# Patient Record
Sex: Male | Born: 1995 | Race: Black or African American | Hispanic: No | Marital: Single | Smoking: Never smoker
Health system: Southern US, Community
[De-identification: ages and names within clinical notes are randomized; demographics above are authoritative.]

---
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
## 2017-05-10 ENCOUNTER — Encounter: Payer: Self-pay | Admitting: Emergency Medicine

## 2017-05-10 ENCOUNTER — Emergency Department
Admission: EM | Admit: 2017-05-10 | Discharge: 2017-05-10 | Disposition: A | Discharge status: Home / Self Care | Payer: Medicaid Other | Attending: Student in an Organized Health Care Education/Training Program | Admitting: Student in an Organized Health Care Education/Training Program

## 2017-05-10 DIAGNOSIS — R197 Diarrhea, unspecified: Secondary | ICD-10-CM | POA: Diagnosis not present

## 2017-05-10 DIAGNOSIS — R1013 Epigastric pain: Secondary | ICD-10-CM | POA: Insufficient documentation

## 2017-05-10 DIAGNOSIS — R112 Nausea with vomiting, unspecified: Secondary | ICD-10-CM

## 2017-05-10 MED ORDER — ONDANSETRON 4 MG PO TBDP
4.0000 mg | ORAL_TABLET | Freq: Once | ORAL | Status: AC
  Administered 2017-05-10: 4 mg via ORAL
  Filled 2017-05-10: qty 1

## 2017-05-10 MED ORDER — GI COCKTAIL ~~LOC~~
30.0000 mL | Freq: Once | ORAL | Status: AC
  Administered 2017-05-10: 30 mL via ORAL
  Filled 2017-05-10: qty 30

## 2017-05-10 MED ORDER — PROMETHAZINE HCL 12.5 MG PO TABS: 12.5000 mg | ORAL_TABLET | Freq: Four times a day (QID) | ORAL | 0 refills | Status: DC | PRN

## 2017-05-10 NOTE — ED Notes (Addendum)
Patient feeling better with decreased abdominal pain after medication.  Patient tolerated fluids well with no N/V/D. MD notified.

## 2017-05-10 NOTE — ED Provider Notes (Signed)
[facility] Emergency Department Provider Note    First MD Initiated Contact with Patient 05/10/17 1758     (approximate)  I have reviewed the triage vital signs and the nursing notes.   HISTORY  Chief Complaint Abdominal Pain    HPI Tim Willis is a 21 y.o. male presents with generalized epigastric pain that started yesterday associated with nausea vomiting and diarrhea. He is having nonbloody nonbilious vomiting and loose stools without any blood in it. He's not been having any fevers. He has not tried to eat anything due to upset stomach. His mother is sick with similar symptoms that started 2 days ago and he caught similar illness. Denies any chronic medical issues. No family history of inflammatory bowel disease. Patient does not want any blood work done and just wants something to help settle his stomach.   History reviewed. No pertinent past medical history. No family history on file. History reviewed. No pertinent surgical history. There are no active problems to display for this patient.     Prior to Admission medications   Medication Sig Start Date End Date Taking? Authorizing Provider  amoxicillin (AMOXIL) 500 MG tablet Take 1 tablet (500 mg total) by mouth 3 (three) times daily. 05/20/15   Triplett, Cari B, FNP  lidocaine (XYLOCAINE) 2 % solution Use as directed 20 mLs in the mouth or throat as needed for mouth pain. 08/18/15   Triplett, Cari B, FNP  triamcinolone (KENALOG) 0.1 % paste Use as directed 1 application in the mouth or throat 2 (two) times daily. 07/14/15   Joni ReiningSmith, Ronald K, PA-C    Allergies Patient has no known allergies.    Social History Social History  Substance Use Topics  . Smoking status: Never Smoker  . Smokeless tobacco: Never Used  . Alcohol use No    Review of Systems Patient denies headaches, rhinorrhea, blurry vision, numbness, shortness of breath, chest pain, edema, cough, abdominal pain, nausea, vomiting,  diarrhea, dysuria, fevers, rashes or hallucinations unless otherwise stated above in HPI. ____________________________________________   PHYSICAL EXAM:  VITAL SIGNS: Vitals:   05/10/17 1749  BP: 136/87  Pulse: 93  Resp: 16  Temp: 98.5 F (36.9 C)    Constitutional: Alert and oriented. Well appearing and in no acute distress. Eyes: Conjunctivae are normal. PERRL. EOMI. Head: Atraumatic. Nose: No congestion/rhinnorhea. Mouth/Throat: Mucous membranes are moist.  Oropharynx non-erythematous. Neck: No stridor. Painless ROM. No cervical spine tenderness to palpation Hematological/Lymphatic/Immunilogical: No cervical lymphadenopathy. Cardiovascular: Normal rate, regular rhythm. Grossly normal heart sounds.  Good peripheral circulation. Respiratory: Normal respiratory effort.  No retractions. Lungs CTAB. Gastrointestinal: Soft and nontender. No distention. No abdominal bruits. No CVA tenderness. Genitourinary:  Musculoskeletal: No lower extremity tenderness nor edema.  No joint effusions. Neurologic:  Normal speech and language. No gross focal neurologic deficits are appreciated. No gait instability. Skin:  Skin is warm, dry and intact. No rash noted. Psychiatric: Mood and affect are normal. Speech and behavior are normal.  ____________________________________________   LABS (all labs ordered are listed, but only abnormal results are displayed)  No results found for this or any previous visit (from the past 24 hour(s)). ____________________________________________ ____________________________________________   PROCEDURES  Procedure(s) performed:  Procedures    Critical Care performed: no ____________________________________________   INITIAL IMPRESSION / ASSESSMENT AND PLAN / ED COURSE  Pertinent labs & imaging results that were available during my care of the patient were reviewed by me and considered in my medical decision making (see chart for  details).  DDX:  enteritis, gastritis, viral illness  Tim Willis is a 21 y.o. who presents to the ED with epigastric pain and nausea vomiting and diarrhea as described above.  Patient is AFVSS in ED. Exam as above. Given current presentation have considered the above differential.  Patient's abdominal exam is diffusely nontender and benign. I recommended IV fluids and evaluation laboratory testing but the patient has declined this. As he is otherwise healthy with stable vital signs do think it's reasonable to trial of Zofran ODT. Patient was given this with the GI cocktail was able to tolerate oral hydration and felt much better. Repeat abdominal exam was soft and benign. Discussed signs and symptoms for which the patient should immediately return.  Patient was able to tolerate PO and was able to ambulate with a steady gait.  Have discussed with the patient and available family all diagnostics and treatments performed thus far and all questions were answered to the best of my ability. The patient demonstrates understanding and agreement with plan.       ____________________________________________   FINAL CLINICAL IMPRESSION(S) / ED DIAGNOSES  Final diagnoses:  Nausea vomiting and diarrhea      NEW MEDICATIONS STARTED DURING THIS VISIT:  New Prescriptions   No medications on file     Note:  This document was prepared using Dragon voice recognition software and may include unintentional dictation errors.    Willy Eddy, MD 05/10/17 2220

## 2017-05-10 NOTE — ED Triage Notes (Signed)
Pt comes into the ED via POv c/o generalize epigastric pain that started yesterday.  Patient has N/V/D and has been exposed to a stomach virus from his mother.  Patient in NAD at this time with even and unlabored respirations.  Patient denies vomiting any today, but states he is still having multiple episodes of diarrhea today.

## 2017-05-10 NOTE — ED Notes (Signed)
NAD noted at time of D/C. Pt denies questions or concerns. Pt ambulatory to the lobby at this time.  

## 2017-05-23 ENCOUNTER — Emergency Department
Admission: EM | Admit: 2017-05-23 | Discharge: 2017-05-24 | Disposition: A | Discharge status: Home / Self Care | Payer: Medicaid Other | Attending: Emergency Medicine | Admitting: Emergency Medicine

## 2017-05-23 ENCOUNTER — Emergency Department: Payer: Medicaid Other

## 2017-05-23 ENCOUNTER — Encounter: Payer: Self-pay | Admitting: Emergency Medicine

## 2017-05-23 DIAGNOSIS — N50812 Left testicular pain: Secondary | ICD-10-CM

## 2017-05-23 DIAGNOSIS — I861 Scrotal varices: Secondary | ICD-10-CM | POA: Insufficient documentation

## 2017-05-23 LAB — URINALYSIS, COMPLETE (UACMP) WITH MICROSCOPIC
BILIRUBIN URINE: NEGATIVE
Glucose, UA: NEGATIVE mg/dL
HGB URINE DIPSTICK: NEGATIVE
Ketones, ur: NEGATIVE mg/dL
LEUKOCYTES UA: NEGATIVE
Nitrite: NEGATIVE
PH: 7 (ref 5.0–8.0)
Protein, ur: NEGATIVE mg/dL
SPECIFIC GRAVITY, URINE: 1.024 (ref 1.005–1.030)

## 2017-05-23 NOTE — ED Notes (Signed)
Pt is unable to void for UA at this time; understands specimen needed when he is able to void; pt will notify staff when he is able to provide specimen

## 2017-05-23 NOTE — ED Notes (Signed)
Pt ambulatory with steady gait to treatment room; verbal report given to Danelle EarthlyNoel, RN and Dr Dolores FrameSung

## 2017-05-23 NOTE — ED Triage Notes (Signed)
Pt reports left testicular pain with swelling x 1 week; here tonight because the pain has increased, but no increase in swelling; denies penile discharge; denies urinary s/s; pt says he has had intercourse this week and that did not worsen his symptoms

## 2017-05-24 MED ORDER — IBUPROFEN 800 MG PO TABS: 800.0000 mg | ORAL_TABLET | Freq: Three times a day (TID) | ORAL | 0 refills | Status: DC | PRN

## 2017-05-24 MED ORDER — KETOROLAC TROMETHAMINE 30 MG/ML IJ SOLN
60.0000 mg | Freq: Once | INTRAMUSCULAR | Status: AC
  Administered 2017-05-24: 60 mg via INTRAMUSCULAR
  Filled 2017-05-24: qty 2

## 2017-05-24 NOTE — ED Provider Notes (Signed)
[facility] Emergency Department Provider Note   ____________________________________________   First MD Initiated Contact with Patient 05/24/17 0016     (approximate)  I have reviewed the triage vital signs and the nursing notes.   HISTORY  Chief Complaint Testicle Pain    HPI Tim Willis is a 21 y.o. male who presents to the ED from home with a chief complaint of left testicle pain. Patient reports left testicular pain with swelling 1 week. Presents tonight secondary to increase in pain. Denies penile discharge, dysuria or hematuria. Had sexual intercourse this week which did not worsen his symptoms. Denies associated fever, chills, chest pain, shortness of breath, abdominal pain, nausea, vomiting, diarrhea. Denies recent travel or trauma. Nothing makes his symptoms better or worse.   Past medical history None  There are no active problems to display for this patient.   History reviewed. No pertinent surgical history.  Prior to Admission medications   Medication Sig Start Date End Date Taking? Authorizing Provider  ibuprofen (ADVIL,MOTRIN) 800 MG tablet Take 1 tablet (800 mg total) by mouth every 8 (eight) hours as needed for moderate pain. 05/24/17   Irean Hong, MD    Allergies Patient has no known allergies.  History reviewed. No pertinent family history.  Social History Social History  Substance Use Topics  . Smoking status: Never Smoker  . Smokeless tobacco: Never Used  . Alcohol use No    Review of Systems  Constitutional: No fever/chills. Eyes: No visual changes. ENT: No sore throat. Cardiovascular: Denies chest pain. Respiratory: Denies shortness of breath. Gastrointestinal: No abdominal pain.  No nausea, no vomiting.  No diarrhea.  No constipation. Genitourinary: Positive for left testicle pain and swelling. Negative for dysuria. Musculoskeletal: Negative for back pain. Skin: Negative for rash. Neurological: Negative  for headaches, focal weakness or numbness.   ____________________________________________   PHYSICAL EXAM:  VITAL SIGNS: ED Triage Vitals  Enc Vitals Group     BP 05/23/17 2150 130/77     Pulse Rate 05/23/17 2150 84     Resp 05/23/17 2150 18     Temp 05/23/17 2150 98.5 F (36.9 C)     Temp Source 05/23/17 2150 Oral     SpO2 05/23/17 2150 99 %     Weight 05/23/17 2150 150 lb (68 kg)     Height 05/23/17 2150 5\' 7"  (1.702 m)     Head Circumference --      Peak Flow --      Pain Score 05/23/17 2149 8     Pain Loc --      Pain Edu? --      Excl. in GC? --     Constitutional: Alert and oriented. Well appearing and in no acute distress. Eyes: Conjunctivae are normal. PERRL. EOMI. Head: Atraumatic. Nose: No congestion/rhinnorhea. Mouth/Throat: Mucous membranes are moist.  Oropharynx non-erythematous. Neck: No stridor.   Cardiovascular: Normal rate, regular rhythm. Grossly normal heart sounds.  Good peripheral circulation. Respiratory: Normal respiratory effort.  No retractions. Lungs CTAB. Gastrointestinal: Soft and nontender to light or deep palpation. No distention. No abdominal bruits. No CVA tenderness. Genitourinary: Circumcised male. No urethral discharge. Right testicle nonswollen and nontender. Left testicle nonswollen and tender to palpation. No horizontal lie. Strong bilateral cremasteric reflexes. Musculoskeletal: No lower extremity tenderness nor edema.  No joint effusions. Neurologic:  Normal speech and language. No gross focal neurologic deficits are appreciated. No gait instability. Skin:  Skin is warm, dry and intact. No rash noted. Psychiatric:  Mood and affect are normal. Speech and behavior are normal.  ____________________________________________   LABS (all labs ordered are listed, but only abnormal results are displayed)  Labs Reviewed  URINALYSIS, COMPLETE (UACMP) WITH MICROSCOPIC - Abnormal; Notable for the following:       Result Value   Color, Urine  YELLOW (*)    APPearance HAZY (*)    Bacteria, UA RARE (*)    Squamous Epithelial / LPF 0-5 (*)    All other components within normal limits   ____________________________________________  EKG  None ____________________________________________  RADIOLOGY  [place] Scrotum  Result Date: 05/23/2017 CLINICAL DATA:  Left testicular swelling and pain EXAM: SCROTAL ULTRASOUND DOPPLER ULTRASOUND OF THE TESTICLES TECHNIQUE: Complete ultrasound examination of the testicles, epididymis, and other scrotal structures was performed. Color and spectral Doppler ultrasound were also utilized to evaluate blood flow to the testicles. COMPARISON:  None. FINDINGS: Right testicle Measurements: 5.0 x 2.7 x 3.1 cm. No mass or microlithiasis visualized. Left testicle Measurements: 4.7 x 2.6 x 2.5 cm. No mass or microlithiasis visualized. Right epididymis:  Normal in size and appearance. Left epididymis:  Normal in size and appearance. Hydrocele:  Small bilateral Varicocele:  Left varicocele measures 3.6 mm. Pulsed Doppler interrogation of both testes demonstrates normal low resistance arterial and venous waveforms bilaterally. IMPRESSION: No acute abnormality of the scrotum.  Small left varicocele. Electronically Signed   By: Deatra RobinsonKevin  Herman M.D.   On: 05/23/2017 23:10   [place] Art/ven Flow Abd Pelv Doppler  Result Date: 05/23/2017 CLINICAL DATA:  Left testicular swelling and pain EXAM: SCROTAL ULTRASOUND DOPPLER ULTRASOUND OF THE TESTICLES TECHNIQUE: Complete ultrasound examination of the testicles, epididymis, and other scrotal structures was performed. Color and spectral Doppler ultrasound were also utilized to evaluate blood flow to the testicles. COMPARISON:  None. FINDINGS: Right testicle Measurements: 5.0 x 2.7 x 3.1 cm. No mass or microlithiasis visualized. Left testicle Measurements: 4.7 x 2.6 x 2.5 cm. No mass or microlithiasis visualized. Right epididymis:  Normal in size and appearance. Left epididymis:  Normal in size  and appearance. Hydrocele:  Small bilateral Varicocele:  Left varicocele measures 3.6 mm. Pulsed Doppler interrogation of both testes demonstrates normal low resistance arterial and venous waveforms bilaterally. IMPRESSION: No acute abnormality of the scrotum.  Small left varicocele. Electronically Signed   By: Deatra RobinsonKevin  Herman M.D.   On: 05/23/2017 23:10    ____________________________________________   PROCEDURES  Procedure(s) performed: None  Procedures  Critical Care performed: No  ____________________________________________   INITIAL IMPRESSION / ASSESSMENT AND PLAN / ED COURSE  Pertinent labs & imaging results that were available during my care of the patient were reviewed by me and considered in my medical decision making (see chart for details).  21 year old male who presents with a one-week history of left testicular pain and swelling. Urinalysis unremarkable. Ultrasound reveals varicocele. Will treat with NSAID, scrotal supporter, and urology follow-up as needed. Strict return precautions given. Patient verbalizes understanding and agrees with plan of care.      ____________________________________________   FINAL CLINICAL IMPRESSION(S) / ED DIAGNOSES  Final diagnoses:  Left varicocele      NEW MEDICATIONS STARTED DURING THIS VISIT:  New Prescriptions   IBUPROFEN (ADVIL,MOTRIN) 800 MG TABLET    Take 1 tablet (800 mg total) by mouth every 8 (eight) hours as needed for moderate pain.     Note:  This document was prepared using Dragon voice recognition software and may include unintentional dictation errors.    Irean HongSung, Jade J, MD 05/24/17  0613  

## 2017-05-24 NOTE — Discharge Instructions (Signed)
1. You may take pain medicine as needed. 2. Elevate scrotum as often as possible. 3. Return to the ER for worsening symptoms, persistent vomiting, difficulty breathing or other concerns.

## 2017-07-06 ENCOUNTER — Emergency Department: Payer: Medicaid Other

## 2017-07-06 ENCOUNTER — Emergency Department
Admission: EM | Admit: 2017-07-06 | Discharge: 2017-07-06 | Disposition: A | Discharge status: Home / Self Care | Payer: Medicaid Other | Attending: Emergency Medicine | Admitting: Emergency Medicine

## 2017-07-06 DIAGNOSIS — S90111A Contusion of right great toe without damage to nail, initial encounter: Secondary | ICD-10-CM | POA: Diagnosis not present

## 2017-07-06 DIAGNOSIS — Y9289 Other specified places as the place of occurrence of the external cause: Secondary | ICD-10-CM | POA: Diagnosis not present

## 2017-07-06 DIAGNOSIS — Y939 Activity, unspecified: Secondary | ICD-10-CM | POA: Insufficient documentation

## 2017-07-06 DIAGNOSIS — Z79899 Other long term (current) drug therapy: Secondary | ICD-10-CM | POA: Diagnosis not present

## 2017-07-06 DIAGNOSIS — Y990 Civilian activity done for income or pay: Secondary | ICD-10-CM | POA: Diagnosis not present

## 2017-07-06 DIAGNOSIS — S99921A Unspecified injury of right foot, initial encounter: Secondary | ICD-10-CM | POA: Diagnosis present

## 2017-07-06 DIAGNOSIS — W208XXA Other cause of strike by thrown, projected or falling object, initial encounter: Secondary | ICD-10-CM | POA: Insufficient documentation

## 2017-07-06 MED ORDER — MELOXICAM 7.5 MG PO TABS: 7.5000 mg | ORAL_TABLET | Freq: Every day | ORAL | 1 refills | Status: AC

## 2017-07-06 NOTE — ED Provider Notes (Signed)
[facility] Emergency Department Provider Note  ____________________________________________  Time seen: Approximately 8:54 PM  I have reviewed the triage vital signs and the nursing notes.   HISTORY  Chief Complaint Toe Injury (great toe, right foot)    HPI Tim Willis is a 21 y.o. male presenting to the emergency department with right great toe pain after a grill at work fell on his right foot. Patient denies radiculopathy, weakness or changes in sensation of the right lower extremity. No prior traumas or surgeries to the right foot. No alleviating measures have been attempted.   History reviewed. No pertinent past medical history.  There are no active problems to display for this patient.   History reviewed. No pertinent surgical history.  Prior to Admission medications   Medication Sig Start Date End Date Taking? Authorizing Provider  ibuprofen (ADVIL,MOTRIN) 800 MG tablet Take 1 tablet (800 mg total) by mouth every 8 (eight) hours as needed for moderate pain. 05/24/17   Irean Hong, MD  meloxicam (MOBIC) 7.5 MG tablet Take 1 tablet (7.5 mg total) by mouth daily. 07/06/17 07/13/17  Orvil Feil, PA-C    Allergies Patient has no known allergies.  History reviewed. No pertinent family history.  Social History Social History  Substance Use Topics  . Smoking status: Never Smoker  . Smokeless tobacco: Never Used  . Alcohol use No     Review of Systems  Constitutional: No fever/chills Eyes: No visual changes. No discharge ENT: No upper respiratory complaints. Cardiovascular: no chest pain. Respiratory: no cough. No SOB. Gastrointestinal: No abdominal pain.  No nausea, no vomiting.  No diarrhea.  No constipation. Musculoskeletal: Patient has right great toe pain. Skin: Negative for rash, abrasions, lacerations, ecchymosis. Neurological: Negative for headaches, focal weakness or  numbness.   ____________________________________________   PHYSICAL EXAM:  VITAL SIGNS: ED Triage Vitals  Enc Vitals Group     BP 07/06/17 2011 128/69     Pulse Rate 07/06/17 2011 64     Resp 07/06/17 2011 15     Temp 07/06/17 2011 98.1 F (36.7 C)     Temp Source 07/06/17 2011 Oral     SpO2 07/06/17 2011 100 %     Weight 07/06/17 2013 145 lb (65.8 kg)     Height 07/06/17 2013 5\' 7"  (1.702 m)     Head Circumference --      Peak Flow --      Pain Score 07/06/17 2011 8     Pain Loc --      Pain Edu? --      Excl. in GC? --      Constitutional: Alert and oriented. Well appearing and in no acute distress. Eyes: Conjunctivae are normal. PERRL. EOMI. Head: Atraumatic. Cardiovascular: Normal rate, regular rhythm. Normal S1 and S2.  Good peripheral circulation. Respiratory: Normal respiratory effort without tachypnea or retractions. Lungs CTAB. Good air entry to the bases with no decreased or absent breath sounds. Musculoskeletal: Patient has 5 out of 5 strength in the lower extremities bilaterally. Patient is able to move all 5 right toes. Tenderness is elicited with palpation along the distal phalanx of the right great toe. Palpable dorsalis pedis pulse bilaterally and symmetrically. Neurologic:  Normal speech and language. No gross focal neurologic deficits are appreciated.  Skin:  Skin is warm, dry and intact. No rash noted. Psychiatric: Mood and affect are normal. Speech and behavior are normal. Patient exhibits appropriate insight and judgement.   ____________________________________________   LABS (all  labs ordered are listed, but only abnormal results are displayed)  Labs Reviewed - No data to display ____________________________________________  EKG   ____________________________________________  RADIOLOGY Geraldo PitterI, Jaclyn M Woods, personally viewed and evaluated these images (plain radiographs) as part of my medical decision making, as well as reviewing the written  report by the radiologist.  Dg Foot Complete Right  Result Date: 07/06/2017 CLINICAL DATA:  Pain in the right great toe after crush injury. EXAM: RIGHT FOOT COMPLETE - 3+ VIEW COMPARISON:  None. FINDINGS: There is no evidence of fracture or dislocation. There is no evidence of arthropathy or other focal bone abnormality. Soft tissues are unremarkable. IMPRESSION: Negative. Electronically Signed   By: Kennith CenterEric  Mansell M.D.   On: 07/06/2017 20:37    ____________________________________________    PROCEDURES  Procedure(s) performed:    Procedures    Medications - No data to display   ____________________________________________   INITIAL IMPRESSION / ASSESSMENT AND PLAN / ED COURSE  Pertinent labs & imaging results that were available during my care of the patient were reviewed by me and considered in my medical decision making (see chart for details).  Review of the [place] CSRS was performed in accordance of the [facility] prior to dispensing any controlled drugs.    Assessment and Plan: Right Great Toe Contusion:  Patient's diagnosis is consistent with right great toe contusion. Patient will be discharged home with prescriptions for Mobic. Patient is to follow up with Dr. Orland Jarredroxler as needed or otherwise directed. Patient is given ED precautions to return to the ED for any worsening or new symptoms.     ____________________________________________  FINAL CLINICAL IMPRESSION(S) / ED DIAGNOSES  Final diagnoses:  Contusion of right great toe without damage to nail, initial encounter      NEW MEDICATIONS STARTED DURING THIS VISIT:  New Prescriptions   MELOXICAM (MOBIC) 7.5 MG TABLET    Take 1 tablet (7.5 mg total) by mouth daily.        This chart was dictated using voice recognition software/Dragon. Despite best efforts to proofread, errors can occur which can change the meaning. Any change was purely unintentional.    Gasper LloydWoods, Jaclyn M, PA-C 07/06/17 2059    Sharman CheekStafford,  Phillip, MD 07/07/17 2325

## 2017-07-06 NOTE — ED Triage Notes (Signed)
Patient reports a girl at work fell onto his great to right foot. Pt c/o pain at the base of the toenail. Pt has no visible swelling/bruising to the area. Pt is able to flex/extend to with no issue.

## 2018-08-24 IMAGING — US US ART/VEN ABD/PELV/SCROTUM DOPPLER LTD
1 series · 14 of 25 positions shown · non-contrast
Comparison: None.

CLINICAL DATA: Left testicular swelling and pain

EXAM:
SCROTAL ULTRASOUND
DOPPLER ULTRASOUND OF THE TESTICLES
TECHNIQUE: Complete ultrasound examination of the testicles, epididymis, and
other scrotal structures was performed. Color and spectral Doppler
ultrasound were also utilized to evaluate blood flow to the
testicles.

[Series 1: us art/ven abd/pelv/scrotum doppler ltd · 0.08mm/px · 14 of 59 slices shown]
[im 1/59]
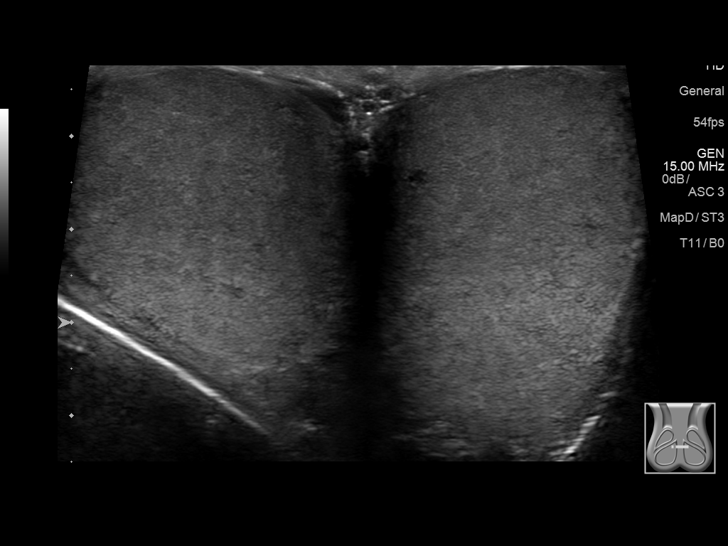
[im 5/59]
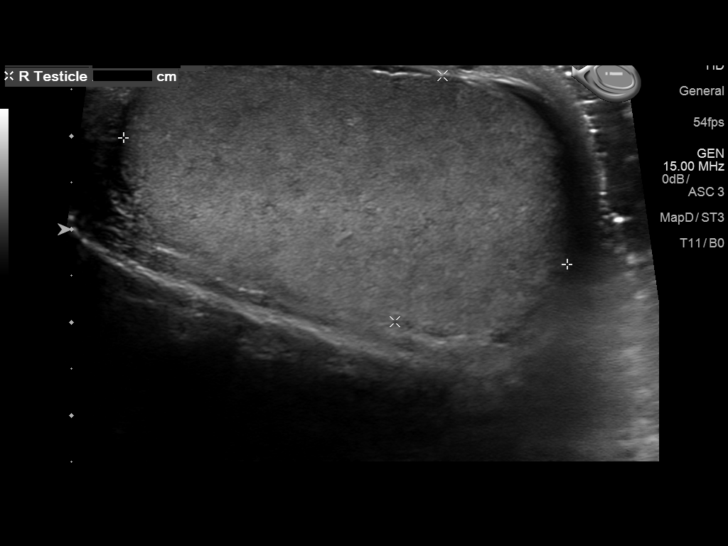
[im 10/59]
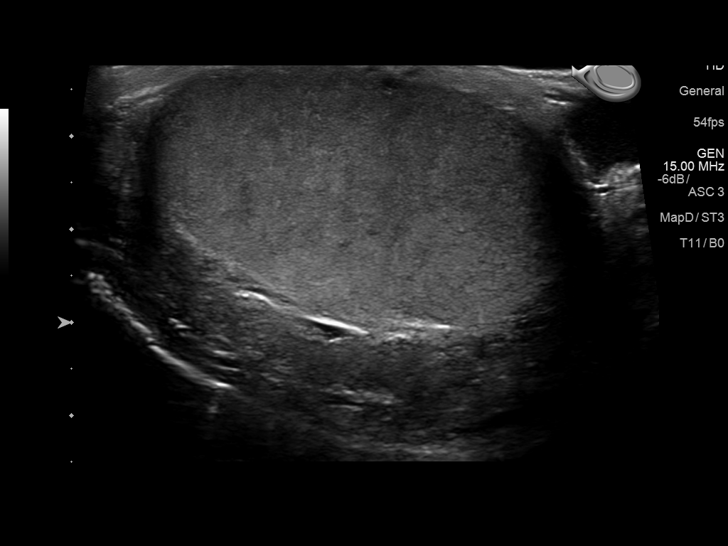
[im 15/59]
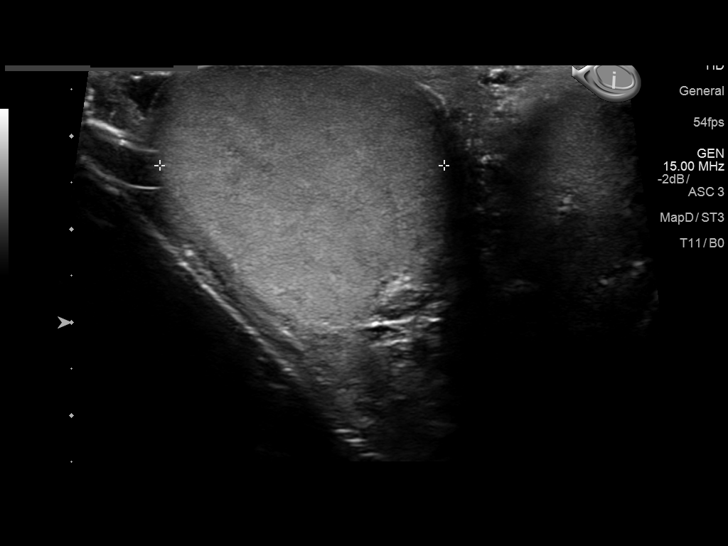
[im 20/59]
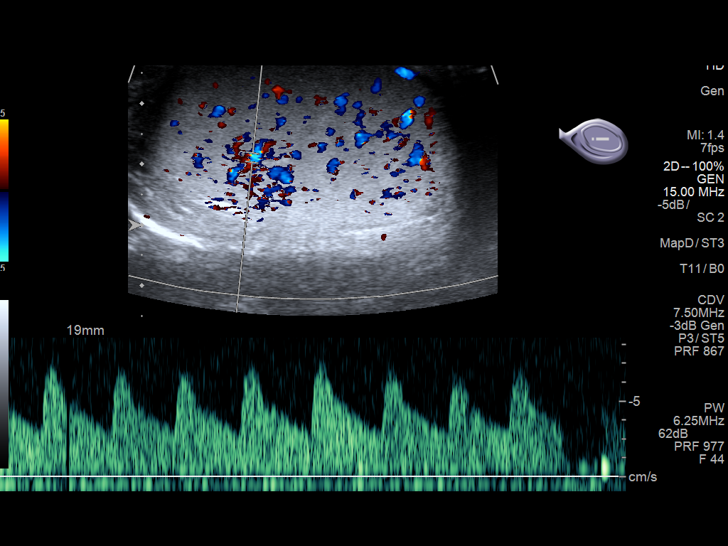
[im 22/59]
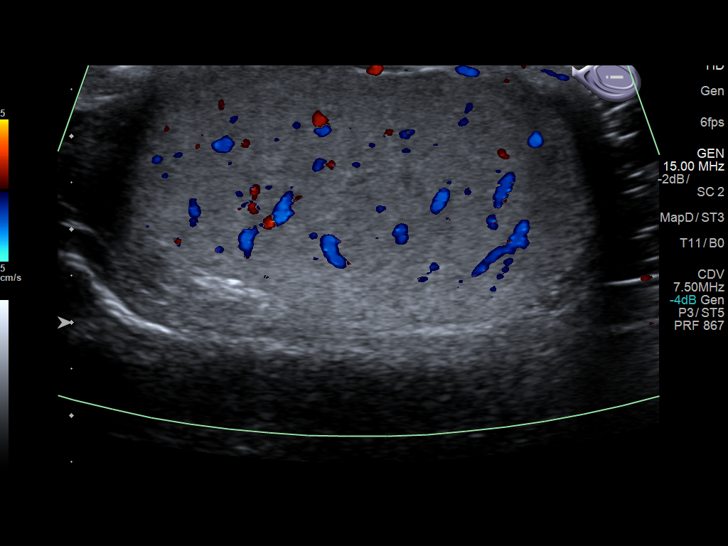
[im 27/59]
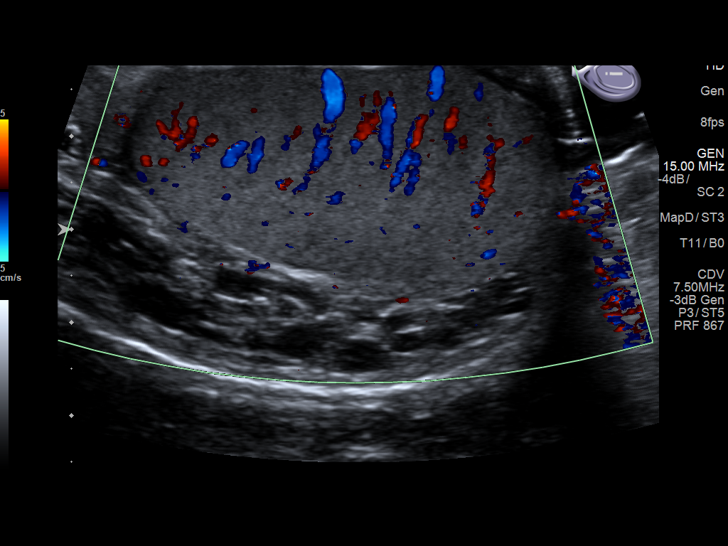
[im 32/59]
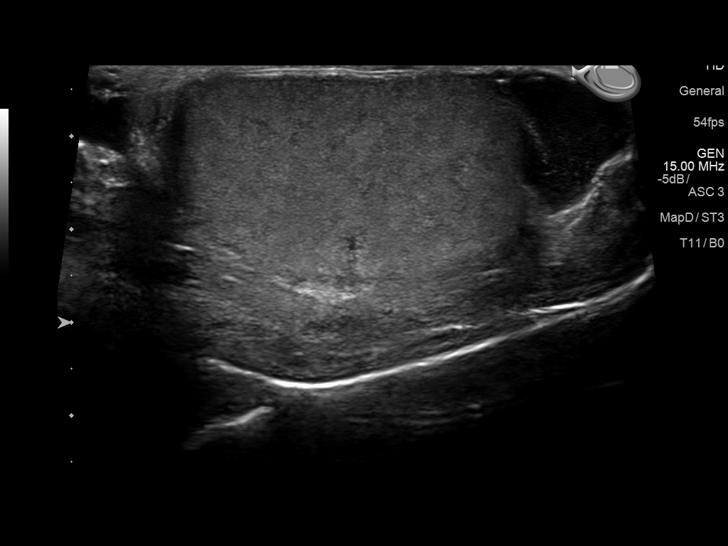
[im 37/59]
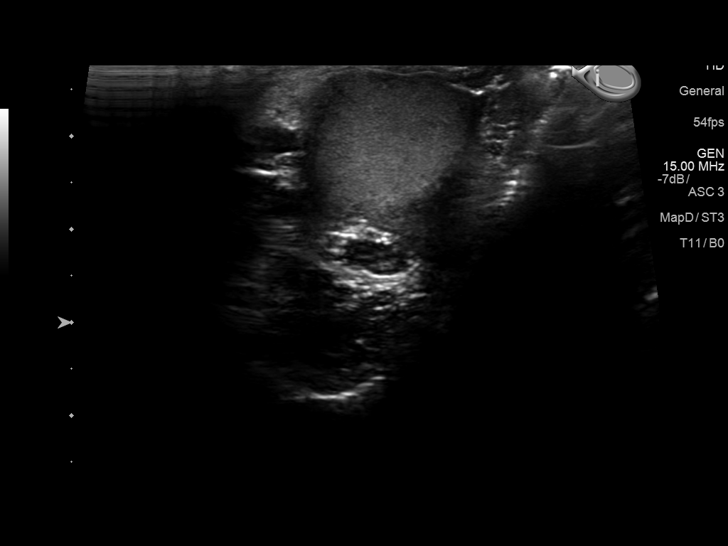
[im 39/59]
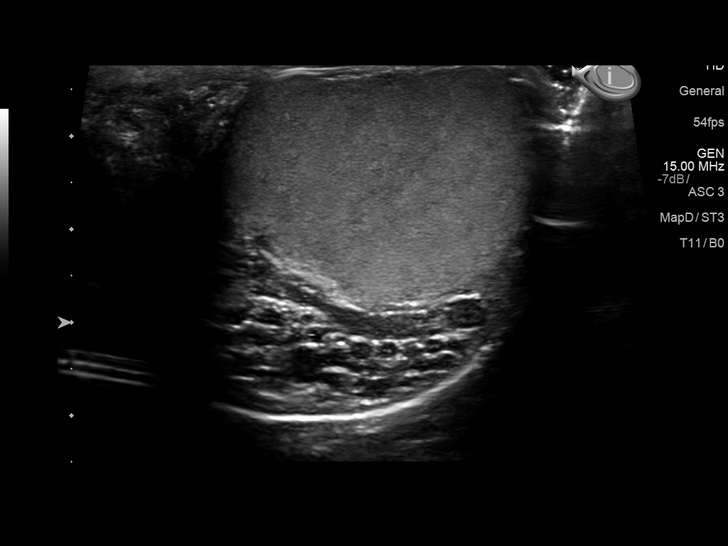
[im 44/59]
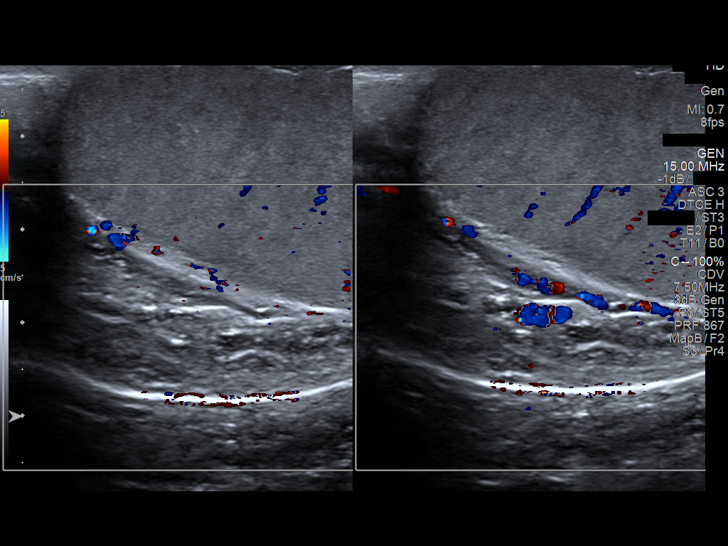
[im 49/59]
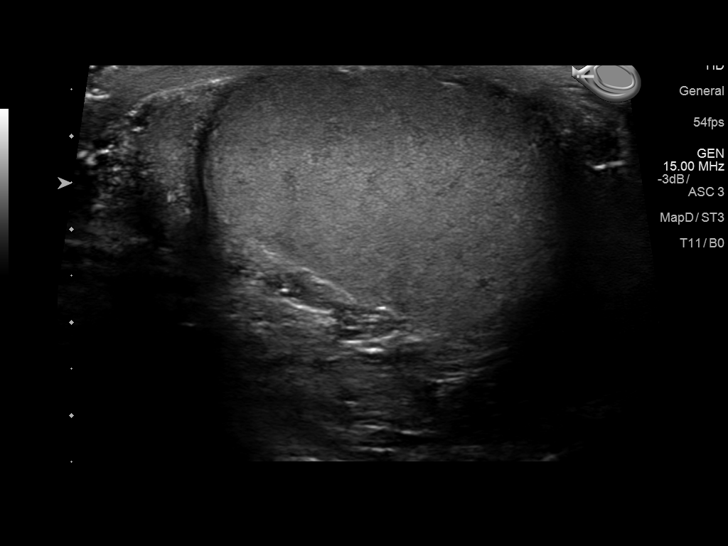
[im 54/59]
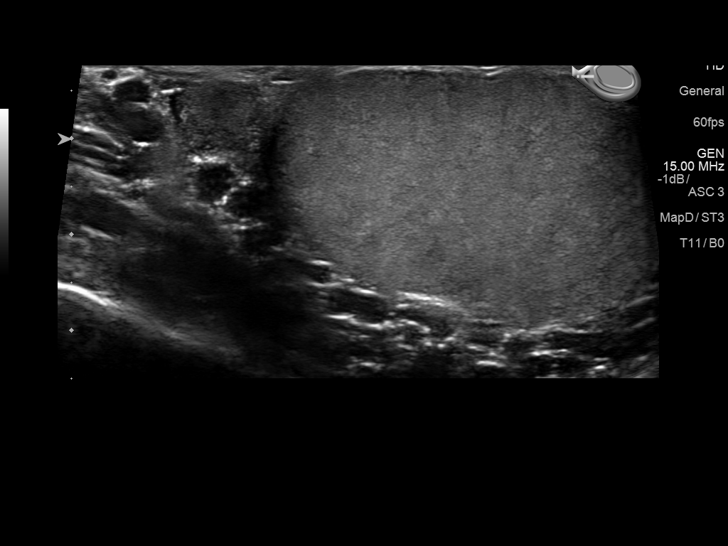
[im 59/59]
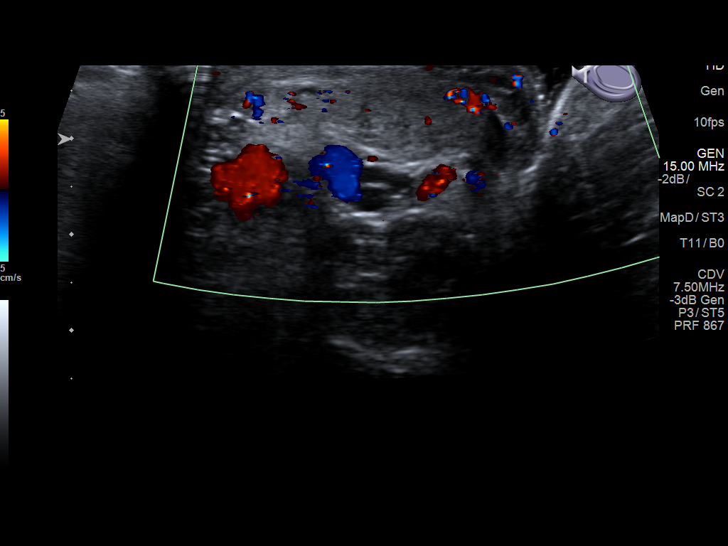

[14 of 25 positions shown; findings below may reference images not displayed]

FINDINGS: Right testicle

Measurements: 5.0 x 2.7 x 3.1 cm. No mass or microlithiasis
visualized.

Left testicle

Measurements: 4.7 x 2.6 x 2.5 cm. No mass or microlithiasis
visualized.

Right epididymis:  Normal in size and appearance.

Left epididymis:  Normal in size and appearance.

Hydrocele:  Small bilateral

Varicocele:  Left varicocele measures 3.6 mm.

Pulsed Doppler interrogation of both testes demonstrates normal low
resistance arterial and venous waveforms bilaterally.
IMPRESSION: No acute abnormality of the scrotum.  Small left varicocele.

## 2018-12-29 ENCOUNTER — Other Ambulatory Visit: Payer: Self-pay

## 2018-12-29 ENCOUNTER — Encounter: Payer: Self-pay | Admitting: Emergency Medicine

## 2018-12-29 ENCOUNTER — Emergency Department
Admission: EM | Admit: 2018-12-29 | Discharge: 2018-12-29 | Disposition: A | Discharge status: Home / Self Care | Payer: Self-pay | Attending: Emergency Medicine | Admitting: Emergency Medicine

## 2018-12-29 ENCOUNTER — Emergency Department: Payer: Self-pay

## 2018-12-29 DIAGNOSIS — I861 Scrotal varices: Secondary | ICD-10-CM | POA: Insufficient documentation

## 2018-12-29 DIAGNOSIS — N5089 Other specified disorders of the male genital organs: Secondary | ICD-10-CM | POA: Insufficient documentation

## 2018-12-29 LAB — CHLAMYDIA/NGC RT PCR (ARMC ONLY)
Chlamydia Tr: DETECTED — AB
N gonorrhoeae: NOT DETECTED

## 2018-12-29 LAB — URINALYSIS, COMPLETE (UACMP) WITH MICROSCOPIC
Bacteria, UA: NONE SEEN
Bilirubin Urine: NEGATIVE
Glucose, UA: NEGATIVE mg/dL
Hgb urine dipstick: NEGATIVE
Ketones, ur: NEGATIVE mg/dL
Leukocytes, UA: NEGATIVE
Nitrite: NEGATIVE
Protein, ur: NEGATIVE mg/dL
Specific Gravity, Urine: 1.019 (ref 1.005–1.030)
pH: 6 (ref 5.0–8.0)

## 2018-12-29 MED ORDER — IBUPROFEN 800 MG PO TABS: 800.0000 mg | ORAL_TABLET | Freq: Three times a day (TID) | ORAL | 0 refills | Status: DC | PRN

## 2018-12-29 NOTE — ED Provider Notes (Signed)
[facility] Emergency Department Provider Note  ____________________________________________  Time seen: Approximately 7:16 PM  I have reviewed the triage vital signs and the nursing notes.   HISTORY  Chief Complaint Testicle Pain    HPI Tim Willis is a 23 y.o. male presents to the emergency department with left-sided testicle pain, swelling and erythema.  Patient reports that he has had left-sided low back pain and increased urinary frequency.  No dysuria or hematuria.  No nausea or vomiting.  Patient denies penile discharge or pain with sexual intercourse.  Patient reports that he was diagnosed with a varicocele several months ago but reports that his pain feels somewhat different.  Patient has been taking ibuprofen for pain which has not relieved his symptoms.  He currently rates his pain at 8 out of 10 out of intensity.  No alleviating measures have been attempted.   History reviewed. No pertinent past medical history.  There are no active problems to display for this patient.   History reviewed. No pertinent surgical history.  Prior to Admission medications   Medication Sig Start Date End Date Taking? Authorizing Provider  ibuprofen (ADVIL,MOTRIN) 800 MG tablet Take 1 tablet (800 mg total) by mouth every 8 (eight) hours as needed for moderate pain. 12/29/18   Orvil FeilWoods, Laiyla Slagel M, PA-C    Allergies Patient has no known allergies.  No family history on file.  Social History Social History   Tobacco Use  . Smoking status: Never Smoker  . Smokeless tobacco: Never Used  Substance Use Topics  . Alcohol use: No  . Drug use: No     Review of Systems  Constitutional: No fever/chills Eyes: No visual changes. No discharge ENT: No upper respiratory complaints. Cardiovascular: no chest pain. Respiratory: no cough. No SOB. Gastrointestinal: No abdominal pain.  No nausea, no vomiting.  No diarrhea.  No constipation. Genitourinary: Negative for dysuria.  No hematuria Musculoskeletal: Negative for musculoskeletal pain. Skin: Negative for rash, abrasions, lacerations, ecchymosis. Neurological: Negative for headaches, focal weakness or numbness.   ____________________________________________   PHYSICAL EXAM:  VITAL SIGNS: ED Triage Vitals  Enc Vitals Group     BP 12/29/18 1801 (!) 143/85     Pulse Rate 12/29/18 1801 62     Resp 12/29/18 1801 16     Temp 12/29/18 1801 98.3 F (36.8 C)     Temp Source 12/29/18 1801 Oral     SpO2 12/29/18 1801 99 %     Weight --      Height --      Head Circumference --      Peak Flow --      Pain Score 12/29/18 1802 0     Pain Loc --      Pain Edu? --      Excl. in GC? --      Constitutional: Alert and oriented. Well appearing and in no acute distress. Eyes: Conjunctivae are normal. PERRL. EOMI. Head: Atraumatic. Cardiovascular: Normal rate, regular rhythm. Normal S1 and S2.  Good peripheral circulation. Respiratory: Normal respiratory effort without tachypnea or retractions. Lungs CTAB. Good air entry to the bases with no decreased or absent breath sounds. Gastrointestinal: Bowel sounds 4 quadrants. Soft and nontender to palpation. No guarding or rigidity. No palpable masses. No distention. No CVA tenderness. Genitourinary: Patient has mild left-sided scrotal swelling.  Musculoskeletal: Full range of motion to all extremities. No gross deformities appreciated. Neurologic:  Normal speech and language. No gross focal neurologic deficits are appreciated.  Skin:  Skin is warm, dry and intact. No rash noted. Psychiatric: Mood and affect are normal. Speech and behavior are normal. Patient exhibits appropriate insight and judgement.   ____________________________________________   LABS (all labs ordered are listed, but only abnormal results are displayed)  Labs Reviewed  URINALYSIS, COMPLETE (UACMP) WITH MICROSCOPIC - Abnormal; Notable for the following components:      Result Value    Color, Urine YELLOW (*)    APPearance CLEAR (*)    All other components within normal limits  CHLAMYDIA/NGC RT PCR [facility] ONLY)   ____________________________________________  EKG   ____________________________________________  RADIOLOGY   US Scrotum W/doppler  Result Date: 12/29/2018 CLINICAL DATA:  23 y/o M; testicular pain, left testicular swelling for a few months. EXAM: SCROTAL ULTRASOUND DOPPLER ULTRASOUND OF THE TESTICLES TECHNIQUE: Complete ultrasound examination of the testicles, epididymis, and other scrotal structures was performed. Color and spectral Doppler ultrasound were also utilized to evaluate blood flow to the testicles. COMPARISON:  05/23/2017 scrotal ultrasound and Doppler. FINDINGS: Right testicle Measurements: 5.0 x 2.2 x 3.5 cm. No mass or microlithiasis visualized. Left testicle Measurements: 4.8 x 2.3 x 3.3 cm. No mass or microlithiasis visualized. Right epididymis:  Normal in size and appearance. Left epididymis:  Normal in size and appearance. Hydrocele:  None visualized. Varicocele:  Stable left varicocele measuring 3.7 mm. Pulsed Doppler interrogation of both testes demonstrates normal low resistance arterial and venous waveforms bilaterally. IMPRESSION: No acute process identified.  Stable small left varicocele. Electronically Signed   By: Mitzi Hansen M.D.   On: 12/29/2018 20:26    ____________________________________________    PROCEDURES  Procedure(s) performed:    Procedures    Medications - No data to display   ____________________________________________   INITIAL IMPRESSION / ASSESSMENT AND PLAN / ED COURSE  Pertinent labs & imaging results that were available during my care of the patient were reviewed by me and considered in my medical decision making (see chart for details).  Review of the [place] CSRS was performed in accordance of the [facility] prior to dispensing any controlled drugs.    Assessment and  Plan: Testicular swelling Varicocele Patient presents to the emergency department with intermittent testicular pain and swelling.  Patient reports that he has had overlying erythema of the testicle.  Patient underwent a scrotal ultrasound in the emergency department which was noncontributory for testicular torsion.  Scrotal ultrasound did reveal a small varicocele consistent with prior ultrasound conducted in 2018.  Patient was discharged with ibuprofen and referred to urology.  Patient education regarding varicocele was given and all patient questions were answered.    ____________________________________________  FINAL CLINICAL IMPRESSION(S) / ED DIAGNOSES  Final diagnoses:  Varicocele      NEW MEDICATIONS STARTED DURING THIS VISIT:  ED Discharge Orders         Ordered    ibuprofen (ADVIL,MOTRIN) 800 MG tablet  Every 8 hours PRN     12/29/18 2036              This chart was dictated using voice recognition software/Dragon. Despite best efforts to proofread, errors can occur which can change the meaning. Any change was purely unintentional.    Gasper Lloyd 12/29/18 2109    Dionne Bucy, MD 12/29/18 2351

## 2018-12-29 NOTE — ED Triage Notes (Addendum)
PT c/o LFT testicle swelling xfew months. PT states he was seen for same symptoms here before and given antibiotics and anti inflammatories . No discoloration noted or GU symptoms noted. VSS

## 2018-12-29 NOTE — ED Notes (Signed)
Discharge and prescription reviewed with patient. Patient stated understanding and had no questions. Patient signed discharge.

## 2018-12-30 ENCOUNTER — Telehealth: Payer: Self-pay | Admitting: Emergency Medicine

## 2018-12-30 NOTE — Telephone Encounter (Signed)
Called patient due to positive chlamydia test and need for treatment.  Per dr Mayford Knifewilliams can call in azithromycin 1 gram po for patient.  There was no answer and no voicemail.  Will send letter if he does not call me back.

## 2021-10-12 ENCOUNTER — Other Ambulatory Visit: Payer: Self-pay

## 2021-10-12 ENCOUNTER — Emergency Department
Admission: EM | Admit: 2021-10-12 | Discharge: 2021-10-12 | Disposition: A | Discharge status: Home / Self Care | Payer: Self-pay | Attending: Emergency Medicine | Admitting: Emergency Medicine

## 2021-10-12 ENCOUNTER — Emergency Department: Payer: Self-pay

## 2021-10-12 ENCOUNTER — Encounter: Payer: Self-pay | Admitting: Emergency Medicine

## 2021-10-12 DIAGNOSIS — G44209 Tension-type headache, unspecified, not intractable: Secondary | ICD-10-CM | POA: Insufficient documentation

## 2021-10-12 MED ORDER — IBUPROFEN 800 MG PO TABS: 800.0000 mg | ORAL_TABLET | Freq: Three times a day (TID) | ORAL | 0 refills | Status: DC | PRN

## 2021-10-12 MED ORDER — METHOCARBAMOL 500 MG PO TABS: 500.0000 mg | ORAL_TABLET | Freq: Four times a day (QID) | ORAL | 0 refills | Status: DC | PRN

## 2021-10-12 NOTE — ED Triage Notes (Signed)
Pt reports HA intermittently for the last several months. Pt reports hurts across the top of his head and forehead. Pt reports last took medication 2 days ago with no relief. Pt denies all other sx's.

## 2021-10-12 NOTE — Discharge Instructions (Signed)
Drink plenty of water.  Dehydration causes bad headaches Have your vision checked as squinting and straining your eyes can give you a headache If not improving with these measures please follow-up with neurology.  Dr. Daisy Blossom phone number is attached but you can see anyone in the clinic. Return emergency department if worse

## 2021-10-12 NOTE — ED Provider Notes (Signed)
[facility] Emergency Department Provider Note  ____________________________________________   Event Date/Time   First MD Initiated Contact with Patient 10/12/21 1057     (approximate)  I have reviewed the triage vital signs and the nursing notes.   HISTORY  Chief Complaint Headache    HPI Tim Willis is a 25 y.o. male presents emergency department complaint of headache for 2 to 3 months.  States it does not always completely go away.  States he is taking over-the-counter medications without any relief.  States his mother had some prescription medicine he tried which also did not help.  States it starts as a pressure at his temples and will radiate back to the neck.  Also has some neck discomfort.  Denies fever or chills.  No chest pain or shortness of breath  History reviewed. No pertinent past medical history.  There are no problems to display for this patient.   History reviewed. No pertinent surgical history.  Prior to Admission medications   Medication Sig Start Date End Date Taking? Authorizing Provider  ibuprofen (ADVIL) 800 MG tablet Take 1 tablet (800 mg total) by mouth every 8 (eight) hours as needed. 10/12/21  Yes Rosemae Mcquown, Roselyn Bering, PA-C  methocarbamol (ROBAXIN) 500 MG tablet Take 1 tablet (500 mg total) by mouth every 6 (six) hours as needed for muscle spasms (tension headache). 10/12/21  Yes Faythe Ghee, PA-C    Allergies Patient has no known allergies.  No family history on file.  Social History Social History   Tobacco Use   Smoking status: Never   Smokeless tobacco: Never  Substance Use Topics   Alcohol use: No   Drug use: No    Review of Systems  Constitutional: No fever/chills Eyes: No visual changes. ENT: No sore throat. Respiratory: Denies cough Cardiovascular: Denies chest pain Gastrointestinal: Denies abdominal pain Genitourinary: Negative for dysuria. Musculoskeletal: Negative for back pain. Skin: Negative  for rash. Psychiatric: no mood changes,     ____________________________________________   PHYSICAL EXAM:  VITAL SIGNS: ED Triage Vitals  Enc Vitals Group     BP 10/12/21 1020 123/89     Pulse Rate 10/12/21 1020 69     Resp 10/12/21 1020 18     Temp 10/12/21 1020 98.9 F (37.2 C)     Temp Source 10/12/21 1020 Oral     SpO2 10/12/21 1020 100 %     Weight 10/12/21 1001 150 lb (68 kg)     Height 10/12/21 1001 5\' 7"  (1.702 m)     Head Circumference --      Peak Flow --      Pain Score 10/12/21 1001 10     Pain Loc --      Pain Edu? --      Excl. in GC? --     Constitutional: Alert and oriented. Well appearing and in no acute distress. Eyes: Conjunctivae are normal.  PERRL, EOMI Head: Atraumatic.  Skull is nontender, no sores, lumps noted Nose: No congestion/rhinnorhea. Mouth/Throat: Mucous membranes are moist.   Neck:  supple no lymphadenopathy noted Cardiovascular: Normal rate, regular rhythm. Heart sounds are normal Respiratory: Normal respiratory effort.  No retractions, lungs c t a  GU: deferred Musculoskeletal: FROM all extremities, warm and well perfused Neurologic:  Normal speech and language.  Cranial nerves II through XII grossly intact, grips equal bilaterally Skin:  Skin is warm, dry and intact. No rash noted. Psychiatric: Mood and affect are normal. Speech and behavior are normal.  ____________________________________________   LABS (all labs ordered are listed, but only abnormal results are displayed)  Labs Reviewed - No data to display ____________________________________________   ____________________________________________  RADIOLOGY  CT of the head  ____________________________________________   PROCEDURES  Procedure(s) performed: No  Procedures    ____________________________________________   INITIAL IMPRESSION / ASSESSMENT AND PLAN / ED COURSE  Pertinent labs & imaging results that were available during my care of the patient  were reviewed by me and considered in my medical decision making (see chart for details).   The patient is a 25 year old male with new onset of headaches.  Patient states headaches have been ongoing for the past 2 to 3 months.  See HPI.  Physical exam shows patient appears stable.  Due to new onset of headache not being relieved by medications, CT of the head ordered  CT of the head is negative for any acute abnormality  I did discuss findings with patient.  Advised him to have a vision check to make sure he is not straining his eyes.  If he is not improving by drinking water and taking these medications he should follow-up with neurology.  States he understands.  He is discharged stable condition.  Tim Willis was evaluated in Emergency Department on 10/12/2021 for the symptoms described in the history of present illness. He was evaluated in the context of the global COVID-19 pandemic, which necessitated consideration that the patient might be at risk for infection with the SARS-CoV-2 virus that causes COVID-19. Institutional protocols and algorithms that pertain to the evaluation of patients at risk for COVID-19 are in a state of rapid change based on information released by regulatory bodies including the CDC and federal and state organizations. These policies and algorithms were followed during the patient's care in the ED.    As part of my medical decision making, I reviewed the following data within the electronic MEDICAL RECORD NUMBER Nursing notes reviewed and incorporated, Old chart reviewed, Radiograph reviewed , Notes from prior ED visits, and [place] Controlled Substance Database  ____________________________________________   FINAL CLINICAL IMPRESSION(S) / ED DIAGNOSES  Final diagnoses:  Tension headache      NEW MEDICATIONS STARTED DURING THIS VISIT:  New Prescriptions   IBUPROFEN (ADVIL) 800 MG TABLET    Take 1 tablet (800 mg total) by mouth every 8 (eight) hours as needed.    METHOCARBAMOL (ROBAXIN) 500 MG TABLET    Take 1 tablet (500 mg total) by mouth every 6 (six) hours as needed for muscle spasms (tension headache).     Note:  This document was prepared using Dragon voice recognition software and may include unintentional dictation errors.    Faythe Ghee, PA-C 10/12/21 1222    Arnaldo Natal, MD 10/12/21 [phone]

## 2022-06-06 ENCOUNTER — Other Ambulatory Visit: Payer: Self-pay

## 2022-06-06 ENCOUNTER — Encounter: Payer: Self-pay | Admitting: Intensive Care

## 2022-06-06 ENCOUNTER — Emergency Department
Admission: EM | Admit: 2022-06-06 | Discharge: 2022-06-06 | Disposition: A | Discharge status: Home / Self Care | Payer: Self-pay | Attending: Emergency Medicine | Admitting: Emergency Medicine

## 2022-06-06 DIAGNOSIS — R519 Headache, unspecified: Secondary | ICD-10-CM | POA: Insufficient documentation

## 2022-06-06 DIAGNOSIS — Y92410 Unspecified street and highway as the place of occurrence of the external cause: Secondary | ICD-10-CM | POA: Diagnosis not present

## 2022-06-06 DIAGNOSIS — S161XXA Strain of muscle, fascia and tendon at neck level, initial encounter: Secondary | ICD-10-CM | POA: Diagnosis not present

## 2022-06-06 DIAGNOSIS — S199XXA Unspecified injury of neck, initial encounter: Secondary | ICD-10-CM | POA: Diagnosis present

## 2022-06-06 DIAGNOSIS — M7918 Myalgia, other site: Secondary | ICD-10-CM

## 2022-06-06 MED ORDER — NAPROXEN 500 MG PO TABS: 500.0000 mg | ORAL_TABLET | Freq: Two times a day (BID) | ORAL | 0 refills | Status: DC
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
## 2022-07-01 ENCOUNTER — Emergency Department
Admission: EM | Admit: 2022-07-01 | Discharge: 2022-07-01 | Disposition: A | Discharge status: Home / Self Care | Payer: 59 | Attending: Emergency Medicine | Admitting: Emergency Medicine

## 2022-07-01 ENCOUNTER — Other Ambulatory Visit: Payer: Self-pay

## 2022-07-01 DIAGNOSIS — K0889 Other specified disorders of teeth and supporting structures: Secondary | ICD-10-CM | POA: Insufficient documentation

## 2022-07-01 MED ORDER — KETOROLAC TROMETHAMINE 15 MG/ML IJ SOLN
15.0000 mg | Freq: Once | INTRAMUSCULAR | Status: AC
  Administered 2022-07-01: 15 mg via INTRAMUSCULAR
  Filled 2022-07-01: qty 1

## 2022-07-01 MED ORDER — CHLORHEXIDINE GLUCONATE 0.12 % MT SOLN: 15.0000 mL | Freq: Two times a day (BID) | OROMUCOSAL | 0 refills | Status: AC

## 2022-07-01 NOTE — ED Triage Notes (Signed)
Pt presents to ED for dental pain. Pt denies fevers or chills. Pt is concerned wisdom teeth are coming in. NAD noted.

## 2022-07-01 NOTE — ED Provider Notes (Signed)
[facility] Provider Note    Event Date/Time   First MD Initiated Contact with Patient 07/01/22 1150     (approximate)   History   Dental Pain   HPI  Tim Willis is a 26 y.o. male who presents today for evaluation of right lower dental pain that began 3 days ago.  Patient reports that it feels like there is a tooth that is trying to grow in.  He denies any trauma.  He has not had any swelling to his neck or face.  He has not had any swelling under his tongue.  He has not had any voice change.  No fevers or chills.  No trouble eating or drinking.  He reports that he has a dentist that he is planning on contacting.  There are no problems to display for this patient.         Physical Exam   Triage Vital Signs: ED Triage Vitals  Enc Vitals Group     BP 07/01/22 1140 110/88     Pulse Rate 07/01/22 1140 79     Resp 07/01/22 1140 16     Temp 07/01/22 1140 97.7 F (36.5 C)     Temp Source 07/01/22 1140 Oral     SpO2 07/01/22 1140 100 %     Weight --      Height --      Head Circumference --      Peak Flow --      Pain Score 07/01/22 1141 10     Pain Loc --      Pain Edu? --      Excl. in GC? --     Most recent vital signs: Vitals:   07/01/22 1140  BP: 110/88  Pulse: 79  Resp: 16  Temp: 97.7 F (36.5 C)  SpO2: 100%    Physical Exam Vitals and nursing note reviewed.  Constitutional:      General: Awake and alert. No acute distress.    Appearance: Normal appearance. The patient is normal weight.  HENT:     Head: Normocephalic and atraumatic.     Mouth: Mucous membranes are moist.  Right lower posterior molar with inflamed gingiva, though no gingival fluctuance.  No Tenderness to the tooth.  No sublingual swelling.  No trismus.  No swelling to the face or neck, no erythema to the face or neck. Diffuse tartar noted Eyes:     General: PERRL. Normal EOMs        Right eye: No discharge.        Left eye: No discharge.      Conjunctiva/sclera: Conjunctivae normal.  Cardiovascular:     Rate and Rhythm: Normal rate and regular rhythm.     Pulses: Normal pulses.     Heart sounds: Normal heart sounds Pulmonary:     Effort: Pulmonary effort is normal. No respiratory distress.     Breath sounds: Normal breath sounds.  Abdominal:     Abdomen is soft. There is no abdominal tenderness. No rebound or guarding. No distention. Musculoskeletal:        General: No swelling. Normal range of motion.     Cervical back: Normal range of motion and neck supple.  Skin:    General: Skin is warm and dry.     Capillary Refill: Capillary refill takes less than 2 seconds.     Findings: No rash.  Neurological:     Mental Status: The patient is awake and alert.  ED Results / Procedures / Treatments   Labs (all labs ordered are listed, but only abnormal results are displayed) Labs Reviewed - No data to display   EKG     RADIOLOGY     PROCEDURES:  Critical Care performed:   Procedures   MEDICATIONS ORDERED IN ED: Medications  ketorolac (TORADOL) 15 MG/ML injection 15 mg (15 mg Intramuscular Given 07/01/22 1205)     IMPRESSION / MDM / ASSESSMENT AND PLAN / ED COURSE  I reviewed the triage vital signs and the nursing notes.   Differential diagnosis includes, but is not limited to, impacted tooth, pulpitis, dental caries, abscess. Patient was evaluated in the emergency department for dental pain. Left posterior inferior molar with gingival irritation, no gingival swelling or fluctuance concerning for gingival abscess.  No trismus, nuchal rigidity, neck pain, hot potato voice, uvular deviation or malocclusion to suggest deep space infection. No sublingual swelling concerning for Ludwig's angina.  Patient was started chlorhexidine mouth rinse.  He has diffuse tartar noted. Patient was treated symptomatically in the emergency department. Discussed care plan, return precautions, and advised close outpatient  follow-up with dentist. He has a dentist who he plans to call today. Patient agrees with plan of care.    Patient's presentation is most consistent with acute, uncomplicated illness.       FINAL CLINICAL IMPRESSION(S) / ED DIAGNOSES   Final diagnoses:  Pain, dental     Rx / DC Orders   ED Discharge Orders          Ordered    chlorhexidine (PERIDEX) 0.12 % solution  2 times daily        07/01/22 1213             Note:  This document was prepared using Dragon voice recognition software and may include unintentional dictation errors.   Jackelyn Hoehn, PA-C 07/01/22 1322    Merwyn Katos, MD 07/05/22 [phone]

## 2022-07-01 NOTE — Discharge Instructions (Signed)
Please follow up with your dentist. Return for new, worsening or change in symptoms or other concerns.

## 2022-12-02 IMAGING — CT CT HEAD W/O CM
3 series · 16 of 47 positions shown, 19 images · non-contrast
Comparison: None.

CLINICAL DATA: 25-year-old male with persistent headache. Pain
across the forehead and vertex.

EXAM:
CT HEAD WITHOUT CONTRAST
TECHNIQUE: Contiguous axial images were obtained from the base of the skull
through the vertex without intravenous contrast.

[Series 2: head wo · axial · 0.44mm/px · z∈[-115,+10]mm · 10 of 30 slices shown, 13 images]
[im 3/30  brain]
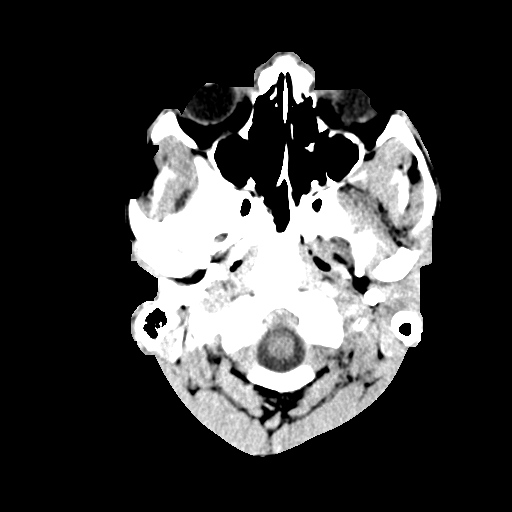
[im 3/30  bone]
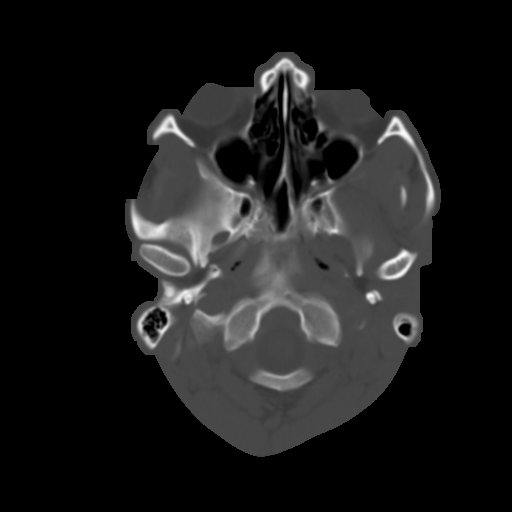
[im 6/30  brain]
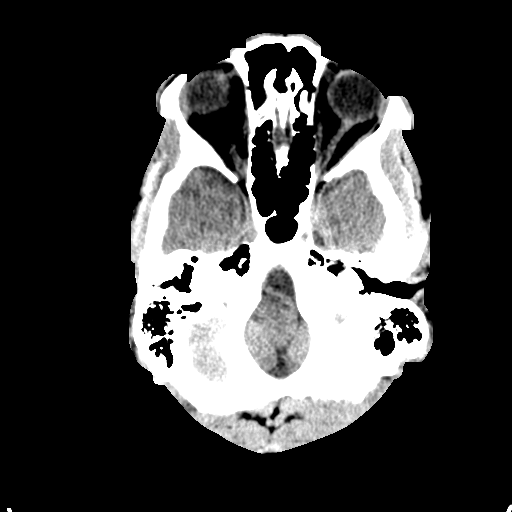
[im 9/30  brain]
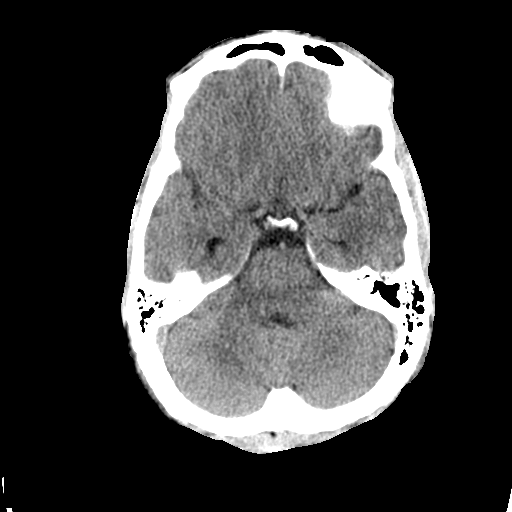
[im 11/30  brain]
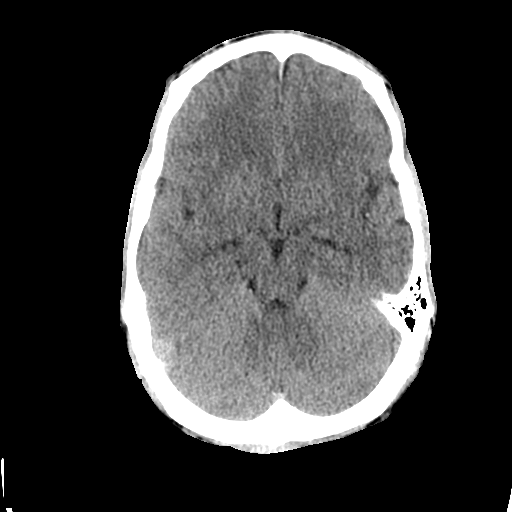
[im 14/30  brain]
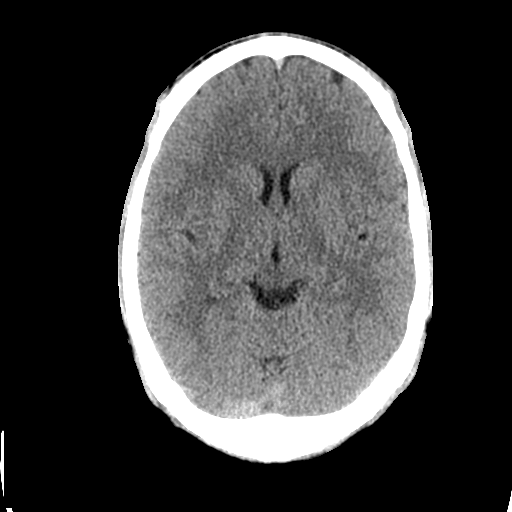
[im 14/30  bone]
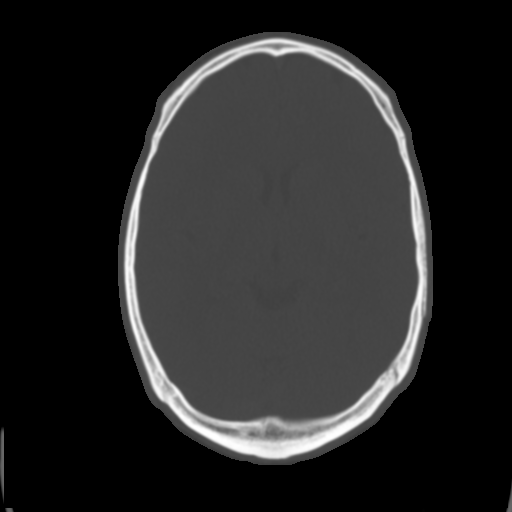
[im 17/30  brain]
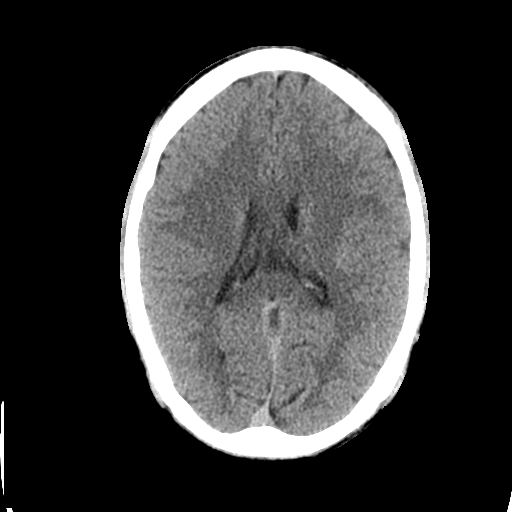
[im 20/30  brain]
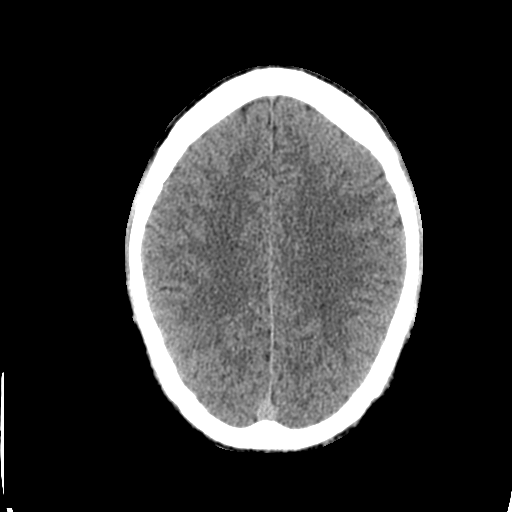
[im 23/30  brain]
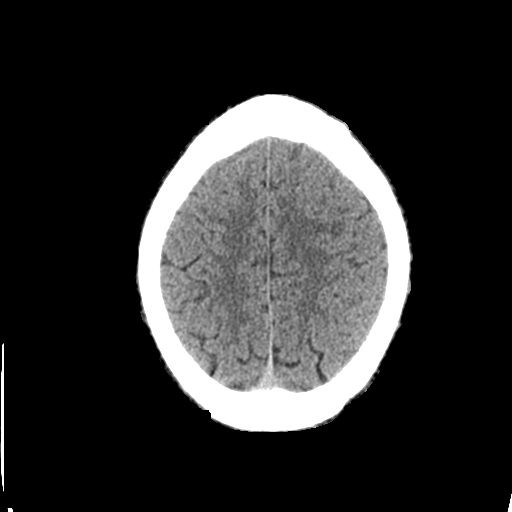
[im 25/30  brain]
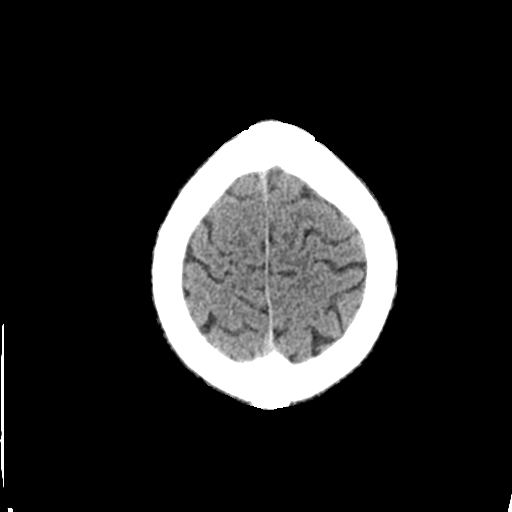
[im 25/30  bone]
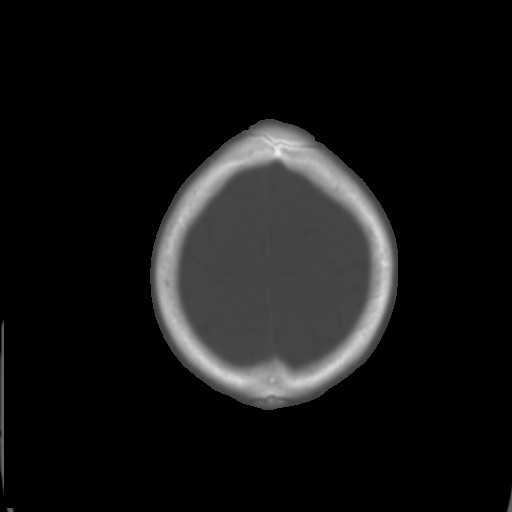
[im 28/30  brain]
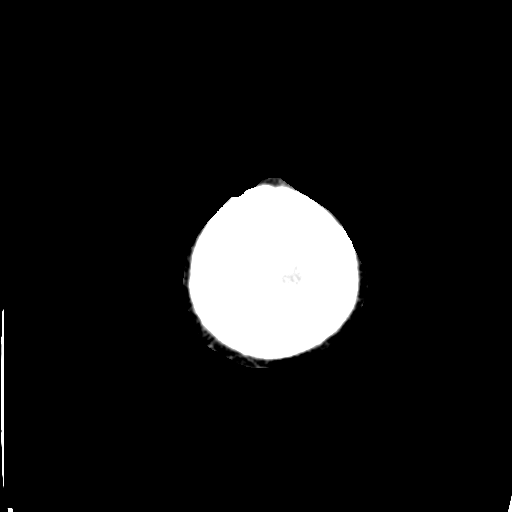

[Series 4: coronal soft tissue · coronal · 0.31mm/px · 3 of 67 slices shown]
[im 23/67  brain]
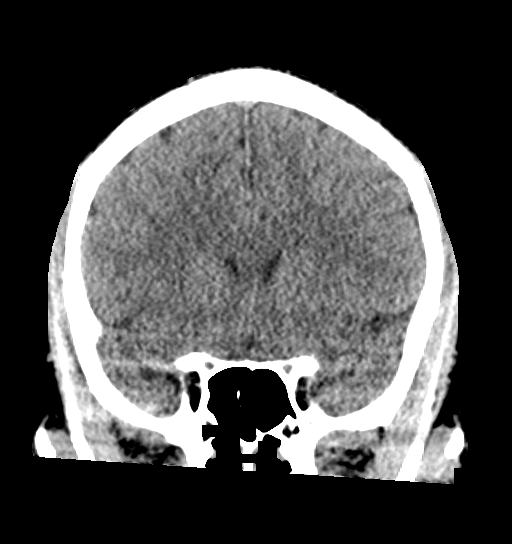
[im 30/67  brain]
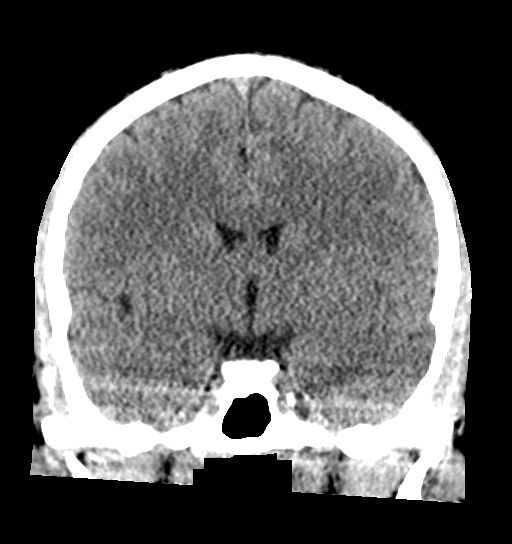
[im 37/67  brain]
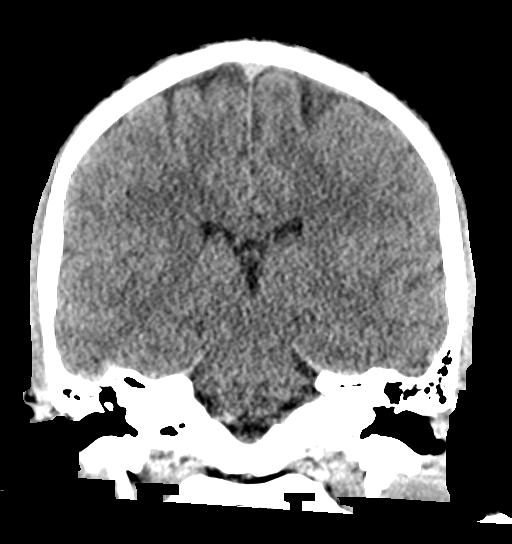

[Series 5: sagittal soft tissue · sagittal · 0.34mm/px · 3 of 51 slices shown]
[im 17/51  brain]
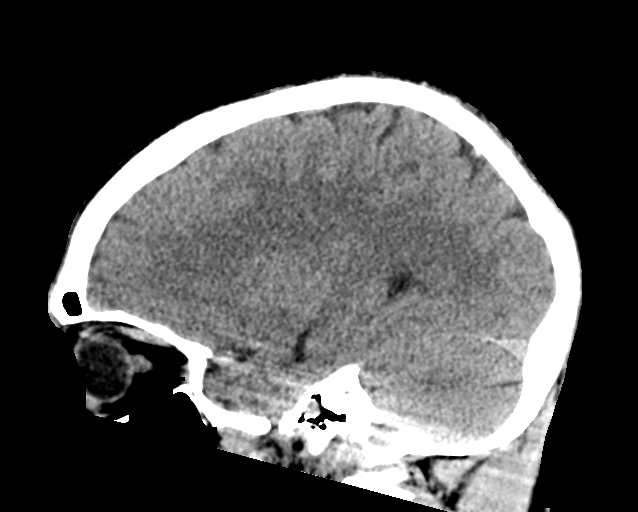
[im 26/51  brain]
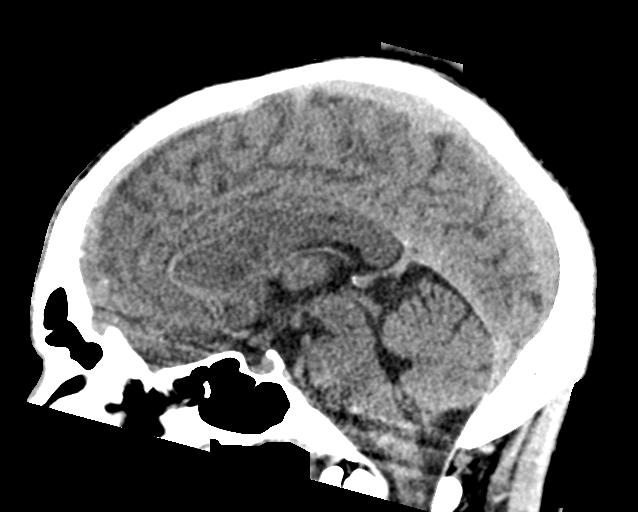
[im 34/51  brain]
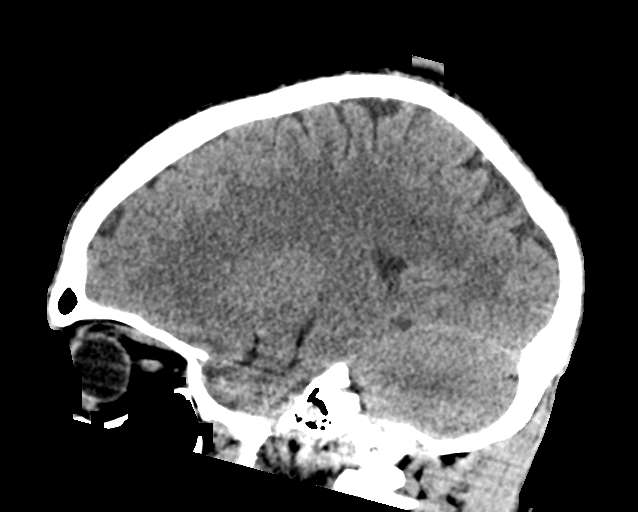

[16 of 47 positions shown; findings below may reference images not displayed]

FINDINGS: Brain: Normal cerebral volume. No midline shift, ventriculomegaly,
mass effect, evidence of mass lesion, intracranial hemorrhage or
evidence of cortically based acute infarction. Gray-white matter
differentiation is within normal limits throughout the brain.

Vascular: No suspicious intracranial vascular hyperdensity.

Skull: Negative.

Sinuses/Orbits: Occasional mild paranasal sinus mucosal thickening,
including possible small mucous retention cysts in some ethmoid air
cells. But no sinus fluid level. Visible tympanic cavities and
mastoids are clear.

Other: Visualized orbits and scalp soft tissues are within normal
limits.
IMPRESSION: 1. Normal noncontrast CT appearance of the brain.
2. Minor paranasal sinus disease, significance doubtful.

## 2024-09-10 ENCOUNTER — Emergency Department
Admission: EM | Admit: 2024-09-10 | Discharge: 2024-09-10 | Disposition: A | Discharge status: Home / Self Care | Payer: Self-pay | Attending: Emergency Medicine | Admitting: Emergency Medicine

## 2024-09-10 ENCOUNTER — Other Ambulatory Visit: Payer: Self-pay

## 2024-09-10 DIAGNOSIS — M5442 Lumbago with sciatica, left side: Secondary | ICD-10-CM | POA: Insufficient documentation

## 2024-09-10 MED ORDER — NAPROXEN 500 MG PO TABS: 500.0000 mg | ORAL_TABLET | Freq: Two times a day (BID) | ORAL | 0 refills | Status: AC

## 2024-09-10 MED ORDER — LIDOCAINE 5 % EX PTCH: 1.0000 | MEDICATED_PATCH | Freq: Two times a day (BID) | CUTANEOUS | 0 refills | Status: AC

## 2024-09-10 MED ORDER — ACETAMINOPHEN 325 MG PO TABS
650.0000 mg | ORAL_TABLET | Freq: Once | ORAL | Status: AC
  Administered 2024-09-10: 650 mg via ORAL
  Filled 2024-09-10: qty 2

## 2024-09-10 MED ORDER — LIDOCAINE 5 % EX PTCH
1.0000 | MEDICATED_PATCH | CUTANEOUS | Status: DC
  Administered 2024-09-10: 1 via TRANSDERMAL
  Filled 2024-09-10: qty 1

## 2024-09-10 MED ORDER — KETOROLAC TROMETHAMINE 15 MG/ML IJ SOLN
15.0000 mg | Freq: Once | INTRAMUSCULAR | Status: AC
  Administered 2024-09-10: 15 mg via INTRAMUSCULAR
  Filled 2024-09-10: qty 1

## 2024-09-10 NOTE — ED Provider Notes (Signed)
 [facility] Provider Note    Event Date/Time   First MD Initiated Contact with Patient 09/10/24 1556     (approximate)   History   Leg Pain (sciatica)   HPI  Tim Willis is a 28 y.o. male with a past medical history of sciatica who presents today for evaluation of left lower back pain with radiation down to his posterior thigh consistent with his previous episodes of sciatica.  Patient reports that this has been an ongoing issue for him since 2021.  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  He reports that he has no pain at rest.  He denies any weakness in his legs.  He has not had any urinary or fecal incontinence or retention.  He is able to ambulate.  He has not taken anything for his pain.  There are no active problems to display for this patient.         Physical Exam   Triage Vital Signs: ED Triage Vitals  Encounter Vitals Group     BP 09/10/24 1549 (!) 122/90     Girls Systolic BP Percentile --      Girls Diastolic BP Percentile --      Boys Systolic BP Percentile --      Boys Diastolic BP Percentile --      Pulse Rate 09/10/24 1549 90     Resp 09/10/24 1549 18     Temp 09/10/24 1549 98.2 F (36.8 C)     Temp Source 09/10/24 1549 Oral     SpO2 09/10/24 1549 98 %     Weight --      Height 09/10/24 1548 5' 7 (1.702 m)     Head Circumference --      Peak Flow --      Pain Score 09/10/24 1547 8     Pain Loc --      Pain Education --      Exclude from Growth Chart --     Most recent vital signs: Vitals:   09/10/24 1549  BP: (!) 122/90  Pulse: 90  Resp: 18  Temp: 98.2 F (36.8 C)  SpO2: 98%    Physical Exam Vitals and nursing note reviewed.  Constitutional:      General: Awake and alert. No acute distress.    Appearance: Normal appearance. The patient is normal weight.  HENT:     Head: Normocephalic and atraumatic.     Mouth:  Mucous membranes are moist.  Eyes:     General: PERRL. Normal EOMs        Right eye: No discharge.        Left eye: No discharge.     Conjunctiva/sclera: Conjunctivae normal.  Cardiovascular:     Rate and Rhythm: Normal rate and regular rhythm.     Pulses: Normal pulses.  Pulmonary:     Effort: Pulmonary effort is normal. No respiratory distress.     Breath sounds: Normal breath sounds.  Abdominal:     Abdomen is soft. There is no abdominal tenderness. No rebound or guarding. No distention. Musculoskeletal:        General: No swelling. Normal range of motion.     Cervical back: Normal range of motion and neck supple.  Back: No midline tenderness.  Mild tenderness to left lumbar paraspinal muscle area.  Strength and sensation 5/5 to bilateral lower extremities. Normal great toe  extension against resistance. Normal sensation throughout feet. Normal patellar reflexes. Negative SLR and opposite SLR bilaterally. Negative FABER test Skin:    General: Skin is warm and dry.     Capillary Refill: Capillary refill takes less than 2 seconds.     Findings: No rash.  Neurological:     Mental Status: The patient is awake and alert.      ED Results / Procedures / Treatments   Labs (all labs ordered are listed, but only abnormal results are displayed) Labs Reviewed - No data to display   EKG     RADIOLOGY I independently reviewed and interpreted imaging and agree with radiologists findings.     PROCEDURES:  Critical Care performed:   Procedures   MEDICATIONS ORDERED IN ED: Medications  lidocaine  (LIDODERM ) 5 % 1 patch (1 patch Transdermal Patch Applied 09/10/24 1623)  ketorolac  (TORADOL ) 15 MG/ML injection 15 mg (15 mg Intramuscular Given 09/10/24 1623)  acetaminophen  (TYLENOL ) tablet 650 mg (650 mg Oral Given 09/10/24 1623)     IMPRESSION / MDM / ASSESSMENT AND PLAN / ED COURSE  I reviewed the triage vital signs and the nursing notes.   Differential diagnosis includes,  but is not limited to, lumbar radiculopathy, muscle strain, muscle spasm.  Patient is awake and alert, hemodynamically stable and afebrile.  He is nontoxic in appearance.  He is ambulatory with a steady gait.  This is a 28 year old male with a history of back pain who presents with back pain. 5 out of 5 strength with intact sensation to extensor hallucis dorsiflexion and plantarflexion of bilateral lower extremities with normal patellar reflexes bilaterally. Most likely etiology at this point is muscle strain vs herniated disc. No red flags to indicate patient is at risk for more auspicious process that would require urgent/emergent spinal imaging or subspecialty evaluation at this time. No major trauma, no midline tenderness, no history or physical exam findings to suggest cauda equina syndrome or spinal cord compression. No focal neurological deficits on exam. No constitutional symptoms or history of immunosuppression or IVDA to suggest potential for epidural abscess. Not anticoagulated, no history of bleeding diastasis to suggest risk for epidural hematoma. No chronic steroid use or advanced age or history of malignancy to suggest proclivity towards pathological fracture.  No abdominal pain or flank pain to suggest kidney stone, no history of kidney stone.  No fever or dysuria or CVAT to suggest pyelonephritis .  No chest pain, back pain, shortness of breath, neurological deficits, to suggest vascular catastrophe, and pulses are equal in all 4 extremities.  Discussed care instructions and return precautions with patient. Recommended close outpatient follow-up for re-evaluation. Patient agrees with plan of care. Will treat the patient symptomatically as needed for pain control. Will discharge patient to take these medications and return for any worsening or different pain or development of any neurologic symptoms. Educated patient regarding expected time course for back pain to improve and recommended very  close outpatient follow-up.  Patient was treated with Toradol , Tylenol , and Lidoderm  patch with good effect.  Upon reevaluation, he reports that his pain is resolved.  He is ambulatory around the emergency department with any difficulty.  He was given a work note saying that he can go back to work tomorrow per his request.  Advised that he should not do any heavy lifting.  Also advised that when he does lift things, he should lift from his legs and not with his back.  Patient understands and agrees with plan.  He  was discharged in stable condition.   Patient's presentation is most consistent with acute complicated illness / injury requiring diagnostic workup.    FINAL CLINICAL IMPRESSION(S) / ED DIAGNOSES   Final diagnoses:  Acute left-sided low back pain with left-sided sciatica     Rx / DC Orders   ED Discharge Orders          Ordered    naproxen  (NAPROSYN ) 500 MG tablet  2 times daily with meals        09/10/24 1716    lidocaine  (LIDODERM ) 5 %  Every 12 hours        09/10/24 1716             Note:  This document was prepared using Dragon voice recognition software and may include unintentional dictation errors.   Cleola Perryman E, PA-C 09/10/24 1734    Bradler, Evan K, MD 09/10/24 [phone]

## 2024-09-10 NOTE — ED Triage Notes (Signed)
 Pt to ed from home via POV for left thigh pain. Pt has HX of sciatica nerve pain and feels this is the same. Pt is caox4, in no acute distress and ambulatory in triage.

## 2024-09-10 NOTE — Discharge Instructions (Signed)
 You may take the medications as prescribed.  Remember to not do any heavy lifting, and lift with your legs and not with your back as we discussed.  Please follow-up with your outpatient provider.  Please return to the emergency department for any new, worsening, or changing symptoms or other concerns including weakness in your legs, urinary or stool incontinence or retention, numbness or tingling in your extremities/buttocks/groin, fevers, or any other concerns or change in symptoms.
